# Patient Record
Sex: Male | Born: 1965 | Race: White | Hispanic: No | Smoking: Current every day smoker
Health system: Southern US, Community
[De-identification: ages and names within clinical notes are randomized; demographics above are authoritative.]

---

## 2020-02-24 DIAGNOSIS — Z419 Encounter for procedure for purposes other than remedying health state, unspecified: Secondary | ICD-10-CM | POA: Diagnosis not present

## 2020-03-26 DIAGNOSIS — Z419 Encounter for procedure for purposes other than remedying health state, unspecified: Secondary | ICD-10-CM | POA: Diagnosis not present

## 2020-04-26 DIAGNOSIS — Z419 Encounter for procedure for purposes other than remedying health state, unspecified: Secondary | ICD-10-CM | POA: Diagnosis not present

## 2020-05-26 DIAGNOSIS — Z419 Encounter for procedure for purposes other than remedying health state, unspecified: Secondary | ICD-10-CM | POA: Diagnosis not present

## 2020-06-26 DIAGNOSIS — Z419 Encounter for procedure for purposes other than remedying health state, unspecified: Secondary | ICD-10-CM | POA: Diagnosis not present

## 2020-07-26 DIAGNOSIS — Z419 Encounter for procedure for purposes other than remedying health state, unspecified: Secondary | ICD-10-CM | POA: Diagnosis not present

## 2020-08-26 DIAGNOSIS — Z419 Encounter for procedure for purposes other than remedying health state, unspecified: Secondary | ICD-10-CM | POA: Diagnosis not present

## 2020-09-26 DIAGNOSIS — Z419 Encounter for procedure for purposes other than remedying health state, unspecified: Secondary | ICD-10-CM | POA: Diagnosis not present

## 2020-10-24 DIAGNOSIS — Z419 Encounter for procedure for purposes other than remedying health state, unspecified: Secondary | ICD-10-CM | POA: Diagnosis not present

## 2020-11-24 DIAGNOSIS — Z419 Encounter for procedure for purposes other than remedying health state, unspecified: Secondary | ICD-10-CM | POA: Diagnosis not present

## 2020-12-24 DIAGNOSIS — Z419 Encounter for procedure for purposes other than remedying health state, unspecified: Secondary | ICD-10-CM | POA: Diagnosis not present

## 2021-01-24 DIAGNOSIS — Z419 Encounter for procedure for purposes other than remedying health state, unspecified: Secondary | ICD-10-CM | POA: Diagnosis not present

## 2021-02-23 DIAGNOSIS — Z419 Encounter for procedure for purposes other than remedying health state, unspecified: Secondary | ICD-10-CM | POA: Diagnosis not present

## 2021-03-26 DIAGNOSIS — Z419 Encounter for procedure for purposes other than remedying health state, unspecified: Secondary | ICD-10-CM | POA: Diagnosis not present

## 2021-04-26 DIAGNOSIS — Z419 Encounter for procedure for purposes other than remedying health state, unspecified: Secondary | ICD-10-CM | POA: Diagnosis not present

## 2021-05-26 DIAGNOSIS — Z419 Encounter for procedure for purposes other than remedying health state, unspecified: Secondary | ICD-10-CM | POA: Diagnosis not present

## 2021-06-26 DIAGNOSIS — Z419 Encounter for procedure for purposes other than remedying health state, unspecified: Secondary | ICD-10-CM | POA: Diagnosis not present

## 2021-07-26 DIAGNOSIS — Z419 Encounter for procedure for purposes other than remedying health state, unspecified: Secondary | ICD-10-CM | POA: Diagnosis not present

## 2021-08-26 DIAGNOSIS — Z419 Encounter for procedure for purposes other than remedying health state, unspecified: Secondary | ICD-10-CM | POA: Diagnosis not present

## 2021-09-26 DIAGNOSIS — Z419 Encounter for procedure for purposes other than remedying health state, unspecified: Secondary | ICD-10-CM | POA: Diagnosis not present

## 2021-10-24 DIAGNOSIS — Z419 Encounter for procedure for purposes other than remedying health state, unspecified: Secondary | ICD-10-CM | POA: Diagnosis not present

## 2021-11-24 DIAGNOSIS — Z419 Encounter for procedure for purposes other than remedying health state, unspecified: Secondary | ICD-10-CM | POA: Diagnosis not present

## 2021-12-24 DIAGNOSIS — Z419 Encounter for procedure for purposes other than remedying health state, unspecified: Secondary | ICD-10-CM | POA: Diagnosis not present

## 2022-01-24 DIAGNOSIS — Z419 Encounter for procedure for purposes other than remedying health state, unspecified: Secondary | ICD-10-CM | POA: Diagnosis not present

## 2022-01-25 ENCOUNTER — Emergency Department
Admission: EM | Admit: 2022-01-25 | Discharge: 2022-01-25 | Disposition: A | Discharge status: Home / Self Care | Payer: No Typology Code available for payment source | Attending: Emergency Medicine | Admitting: Emergency Medicine

## 2022-01-25 ENCOUNTER — Other Ambulatory Visit: Payer: Self-pay

## 2022-01-25 ENCOUNTER — Emergency Department: Payer: No Typology Code available for payment source

## 2022-01-25 DIAGNOSIS — R1031 Right lower quadrant pain: Secondary | ICD-10-CM | POA: Diagnosis not present

## 2022-01-25 DIAGNOSIS — D72829 Elevated white blood cell count, unspecified: Secondary | ICD-10-CM | POA: Diagnosis not present

## 2022-01-25 LAB — URINALYSIS, ROUTINE W REFLEX MICROSCOPIC
Bilirubin Urine: NEGATIVE
Glucose, UA: NEGATIVE mg/dL
Hgb urine dipstick: NEGATIVE
Ketones, ur: NEGATIVE mg/dL
Leukocytes,Ua: NEGATIVE
Nitrite: NEGATIVE
Protein, ur: NEGATIVE mg/dL
Specific Gravity, Urine: 1.004 — ABNORMAL LOW (ref 1.005–1.030)
pH: 6 (ref 5.0–8.0)

## 2022-01-25 LAB — COMPREHENSIVE METABOLIC PANEL
ALT: 21 U/L (ref 0–44)
AST: 20 U/L (ref 15–41)
Albumin: 4.2 g/dL (ref 3.5–5.0)
Alkaline Phosphatase: 66 U/L (ref 38–126)
Anion gap: 6 (ref 5–15)
BUN: 8 mg/dL (ref 6–20)
CO2: 24 mmol/L (ref 22–32)
Calcium: 9.1 mg/dL (ref 8.9–10.3)
Chloride: 108 mmol/L (ref 98–111)
Creatinine, Ser: 0.78 mg/dL (ref 0.61–1.24)
GFR, Estimated: >60 mL/min (ref >60)
Glucose, Bld: 82 mg/dL (ref 70–99)
Potassium: 3.9 mmol/L (ref 3.5–5.1)
Sodium: 138 mmol/L (ref 135–145)
Total Bilirubin: 0.9 mg/dL (ref 0.3–1.2)
Total Protein: 7.4 g/dL (ref 6.5–8.1)

## 2022-01-25 LAB — CBC
HCT: 46.2 % (ref 39.0–52.0)
Hemoglobin: 15.8 g/dL (ref 13.0–17.0)
MCH: 31.4 pg (ref 26.0–34.0)
MCHC: 34.2 g/dL (ref 30.0–36.0)
MCV: 91.8 fL (ref 80.0–100.0)
Platelets: 452 10*3/uL — ABNORMAL HIGH (ref 150–400)
RBC: 5.03 MIL/uL (ref 4.22–5.81)
RDW: 13.6 % (ref 11.5–15.5)
WBC: 13 10*3/uL — ABNORMAL HIGH (ref 4.0–10.5)
nRBC: 0 % (ref 0.0–0.2)

## 2022-01-25 LAB — LIPASE, BLOOD: Lipase: 32 U/L (ref 11–51)

## 2022-01-25 MED ORDER — DICYCLOMINE HCL 10 MG PO CAPS: 10.0000 mg | ORAL_CAPSULE | Freq: Two times a day (BID) | ORAL | 0 refills | Status: AC | PRN

## 2022-01-25 MED ORDER — IOHEXOL 300 MG/ML  SOLN
100.0000 mL | Freq: Once | INTRAMUSCULAR | Status: AC | PRN
  Administered 2022-01-25: 100 mL via INTRAVENOUS

## 2022-01-25 MED ORDER — POLYETHYLENE GLYCOL 3350 17 GM/SCOOP PO POWD: 17.0000 g | Freq: Every day | ORAL | 0 refills | Status: AC

## 2022-01-25 NOTE — ED Triage Notes (Signed)
Pt reports lower R sided abdominal pain x 1 week worse on movement. Pt denies N/V/D or urinary symptoms.

## 2022-01-25 NOTE — ED Provider Notes (Signed)
[facility] Provider Note    Event Date/Time   First MD Initiated Contact with Patient 01/25/22 1733     (approximate)  History   Chief Complaint: Abdominal Pain  HPI  Gary Donovan is a 56 y.o. male with no significant past medical history presents emergency department for right lower quadrant abdominal pain.  According to the patient for the past 1 week or so he has been experiencing a vague sharp discomfort in the right lower quadrant.  Patient denies any nausea vomiting or diarrhea.  No dysuria.  Denies any prior history of kidney stones although states his son has had a kidney stone.  No hematuria.  Physical Exam   Triage Vital Signs: ED Triage Vitals  Enc Vitals Group     BP 01/25/22 1645 (!) 123/95     Pulse Rate 01/25/22 1645 90     Resp 01/25/22 1645 18     Temp 01/25/22 1645 98.5 F (36.9 C)     Temp Source 01/25/22 1645 Oral     SpO2 01/25/22 1645 95 %     Weight 01/25/22 1646 149 lb (67.6 kg)     Height 01/25/22 1646 5\' 11"  (1.803 m)     Head Circumference --      Peak Flow --      Pain Score 01/25/22 1646 5     Pain Loc --      Pain Edu? --      Excl. in GC? --     Most recent vital signs: Vitals:   01/25/22 1645  BP: (!) 123/95  Pulse: 90  Resp: 18  Temp: 98.5 F (36.9 C)  SpO2: 95%    General: Awake, no distress.  CV:  Good peripheral perfusion.  Regular rate and rhythm  Resp:  Normal effort.  Equal breath sounds bilaterally.  Abd:  No distention.  Soft, mild to moderate right lower quadrant tenderness no rebound or guarding.  No distention.    ED Results / Procedures / Treatments   RADIOLOGY  I reviewed the CT images I do not see any acute abnormality on my evaluation. Radiology has read the CT scan is largely negative.   MEDICATIONS ORDERED IN ED: Medications - No data to display   IMPRESSION / MDM / ASSESSMENT AND PLAN / ED COURSE  I reviewed the triage vital signs and the nursing notes.  Patient's  presentation is most consistent with acute presentation with potential threat to life or bodily function.  Patient presents emergency department for lower quadrant abdominal pain x1 week.  Overall the patient appears well, no distress.  Does have mild to moderate right lower quadrant tenderness otherwise benign abdomen.  Patient's labs show mild leukocytosis of 13,000 otherwise normal CBC.  Normal urinalysis without any blood.  And a reassuring chemistry with normal renal function.  Given the patient's continued right lower quadrant pain as well as mild to moderate tenderness to palpation we will obtain CT imaging the abdomen/pelvis to further evaluate.  Patient agreeable to plan of care.  Patient's work-up is overall reassuring.  CT scan does not appear to show any acute abnormality.  Given the patient's reassuring work-up we will discharge with Bentyl as well as MiraLAX.  We will have the patient follow-up with GI medicine should he continue to have right lower quadrant abdominal discomfort.  I discussed return precautions.  Patient agreeable to plan of care.  FINAL CLINICAL IMPRESSION(S) / ED DIAGNOSES   Right lower quadrant abdominal pain  Note:  This document was prepared using Dragon voice recognition software and may include unintentional dictation errors.   Minna Antis, MD 01/25/22 1840

## 2022-02-23 DIAGNOSIS — Z419 Encounter for procedure for purposes other than remedying health state, unspecified: Secondary | ICD-10-CM | POA: Diagnosis not present

## 2022-03-26 DIAGNOSIS — Z419 Encounter for procedure for purposes other than remedying health state, unspecified: Secondary | ICD-10-CM | POA: Diagnosis not present

## 2022-04-26 DIAGNOSIS — Z419 Encounter for procedure for purposes other than remedying health state, unspecified: Secondary | ICD-10-CM | POA: Diagnosis not present

## 2022-05-26 DIAGNOSIS — Z419 Encounter for procedure for purposes other than remedying health state, unspecified: Secondary | ICD-10-CM | POA: Diagnosis not present

## 2022-06-26 DIAGNOSIS — Z419 Encounter for procedure for purposes other than remedying health state, unspecified: Secondary | ICD-10-CM | POA: Diagnosis not present

## 2022-07-26 DIAGNOSIS — Z419 Encounter for procedure for purposes other than remedying health state, unspecified: Secondary | ICD-10-CM | POA: Diagnosis not present

## 2022-08-26 DIAGNOSIS — Z419 Encounter for procedure for purposes other than remedying health state, unspecified: Secondary | ICD-10-CM | POA: Diagnosis not present

## 2022-09-26 DIAGNOSIS — Z419 Encounter for procedure for purposes other than remedying health state, unspecified: Secondary | ICD-10-CM | POA: Diagnosis not present

## 2022-10-25 DIAGNOSIS — Z419 Encounter for procedure for purposes other than remedying health state, unspecified: Secondary | ICD-10-CM | POA: Diagnosis not present

## 2022-11-25 DIAGNOSIS — Z419 Encounter for procedure for purposes other than remedying health state, unspecified: Secondary | ICD-10-CM | POA: Diagnosis not present

## 2022-12-25 DIAGNOSIS — Z419 Encounter for procedure for purposes other than remedying health state, unspecified: Secondary | ICD-10-CM | POA: Diagnosis not present

## 2023-01-25 DIAGNOSIS — Z419 Encounter for procedure for purposes other than remedying health state, unspecified: Secondary | ICD-10-CM | POA: Diagnosis not present

## 2023-02-24 DIAGNOSIS — Z419 Encounter for procedure for purposes other than remedying health state, unspecified: Secondary | ICD-10-CM | POA: Diagnosis not present

## 2023-03-27 DIAGNOSIS — Z419 Encounter for procedure for purposes other than remedying health state, unspecified: Secondary | ICD-10-CM | POA: Diagnosis not present

## 2023-04-27 DIAGNOSIS — Z419 Encounter for procedure for purposes other than remedying health state, unspecified: Secondary | ICD-10-CM | POA: Diagnosis not present

## 2023-05-27 DIAGNOSIS — Z419 Encounter for procedure for purposes other than remedying health state, unspecified: Secondary | ICD-10-CM | POA: Diagnosis not present

## 2023-06-27 DIAGNOSIS — Z419 Encounter for procedure for purposes other than remedying health state, unspecified: Secondary | ICD-10-CM | POA: Diagnosis not present

## 2023-07-27 DIAGNOSIS — Z419 Encounter for procedure for purposes other than remedying health state, unspecified: Secondary | ICD-10-CM | POA: Diagnosis not present

## 2023-08-27 DIAGNOSIS — Z419 Encounter for procedure for purposes other than remedying health state, unspecified: Secondary | ICD-10-CM | POA: Diagnosis not present

## 2023-09-27 DIAGNOSIS — Z419 Encounter for procedure for purposes other than remedying health state, unspecified: Secondary | ICD-10-CM | POA: Diagnosis not present

## 2023-10-25 DIAGNOSIS — Z419 Encounter for procedure for purposes other than remedying health state, unspecified: Secondary | ICD-10-CM | POA: Diagnosis not present

## 2023-12-06 DIAGNOSIS — Z419 Encounter for procedure for purposes other than remedying health state, unspecified: Secondary | ICD-10-CM | POA: Diagnosis not present

## 2024-01-05 DIAGNOSIS — Z419 Encounter for procedure for purposes other than remedying health state, unspecified: Secondary | ICD-10-CM | POA: Diagnosis not present

## 2024-02-05 DIAGNOSIS — Z419 Encounter for procedure for purposes other than remedying health state, unspecified: Secondary | ICD-10-CM | POA: Diagnosis not present

## 2024-03-06 DIAGNOSIS — Z419 Encounter for procedure for purposes other than remedying health state, unspecified: Secondary | ICD-10-CM | POA: Diagnosis not present

## 2024-03-31 IMAGING — CT CT ABD-PELV W/ CM
2 of 5 series · 16 of 46 positions shown, 18 images · IV contrast (agent unspecified)
Comparison: None Available.

CLINICAL DATA: Right lower quadrant pain for 1 week.

EXAM:
CT ABDOMEN AND PELVIS WITH CONTRAST
TECHNIQUE: Multidetector CT imaging of the abdomen and pelvis was performed
using the standard protocol following bolus administration of
intravenous contrast.

[Series 2: routine abd/pel with · axial · 0.75mm/px · z∈[-678,-238]mm · 13 of 100 slices shown, 15 images]
[im 6/100  soft-tissue]
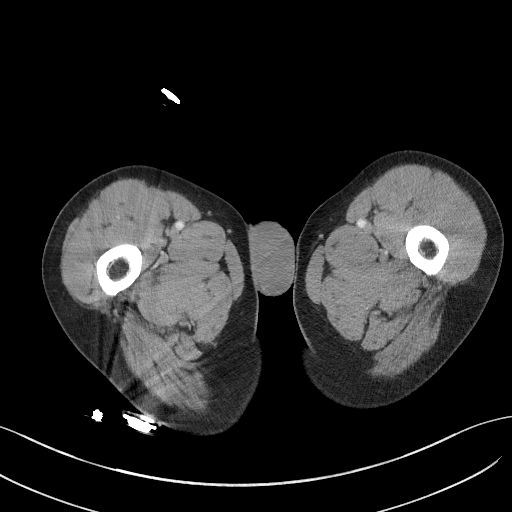
[im 6/100  bone]
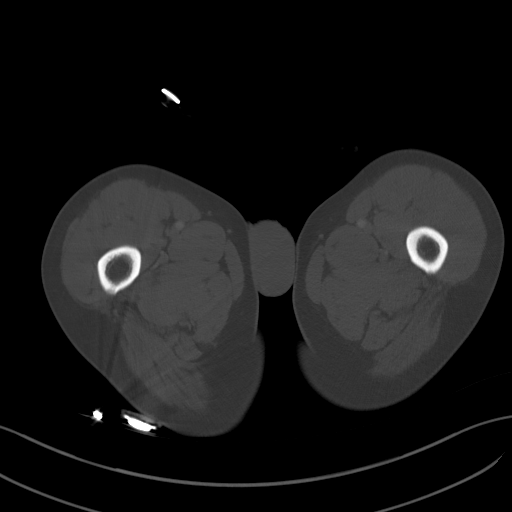
[im 12/100  soft-tissue]
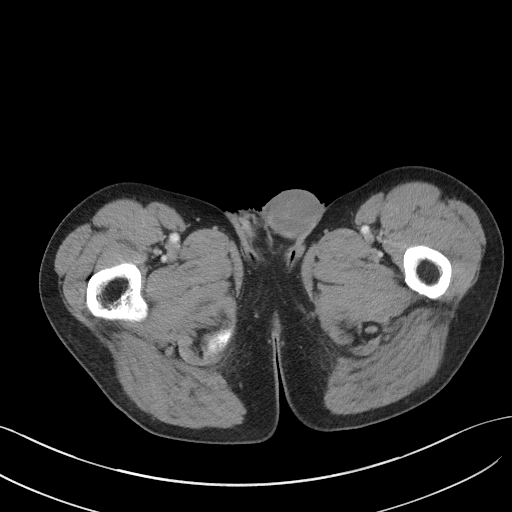
[im 23/100  soft-tissue]
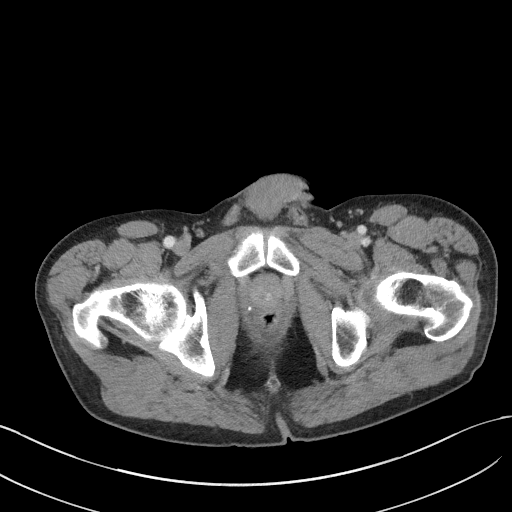
[im 28/100  soft-tissue]
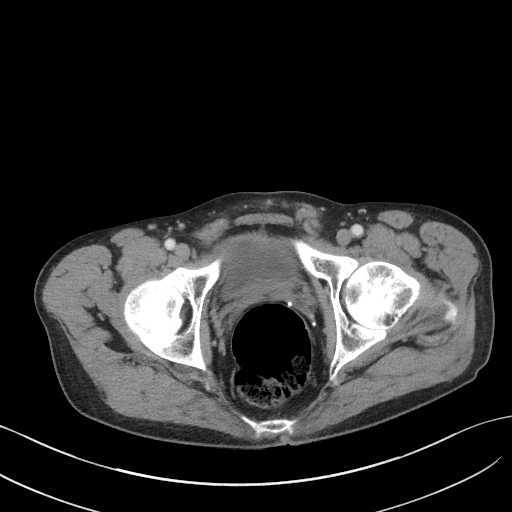
[im 34/100  soft-tissue]
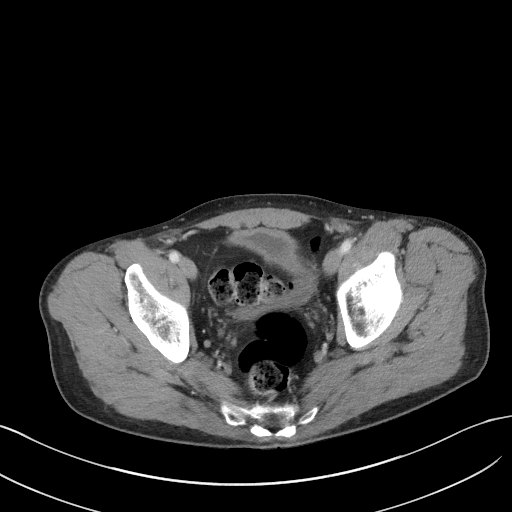
[im 45/100  soft-tissue]
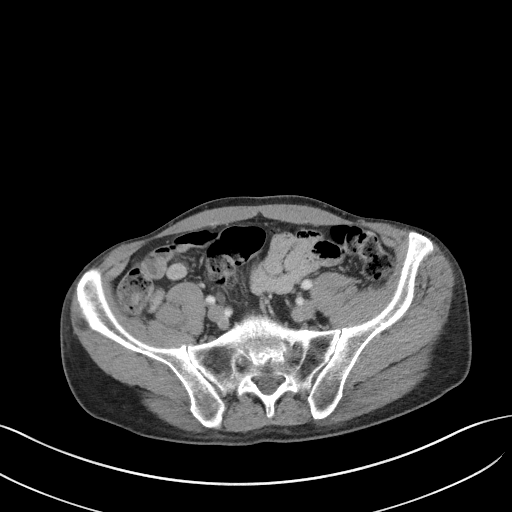
[im 50/100  soft-tissue]
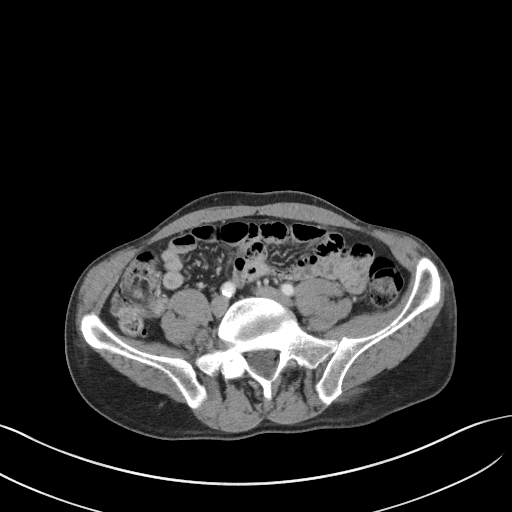
[im 56/100  soft-tissue]
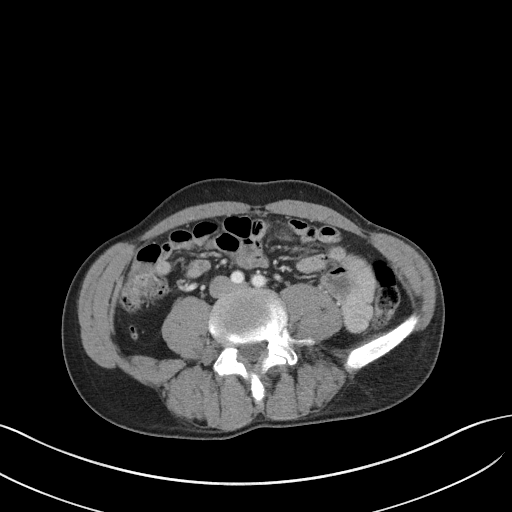
[im 67/100  soft-tissue]
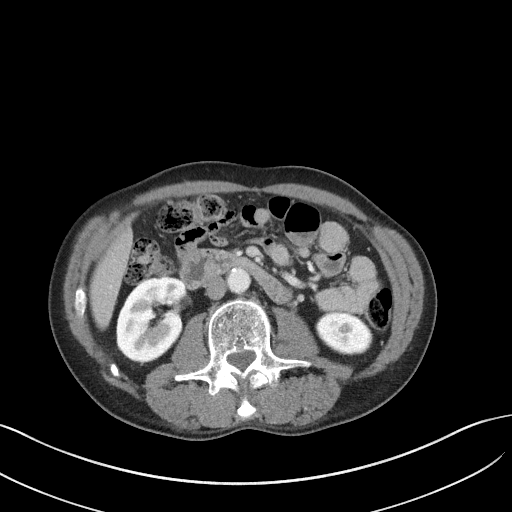
[im 67/100  bone]
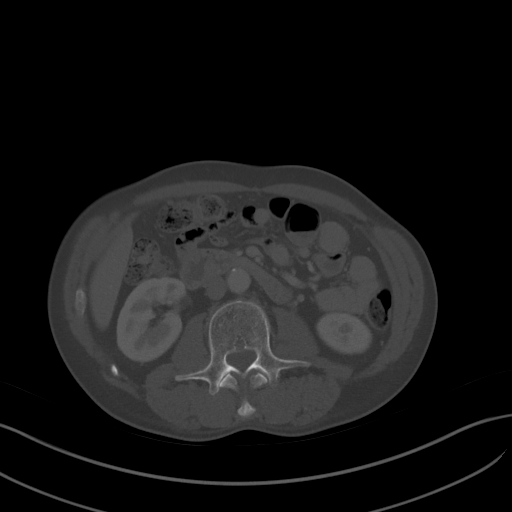
[im 72/100  soft-tissue]
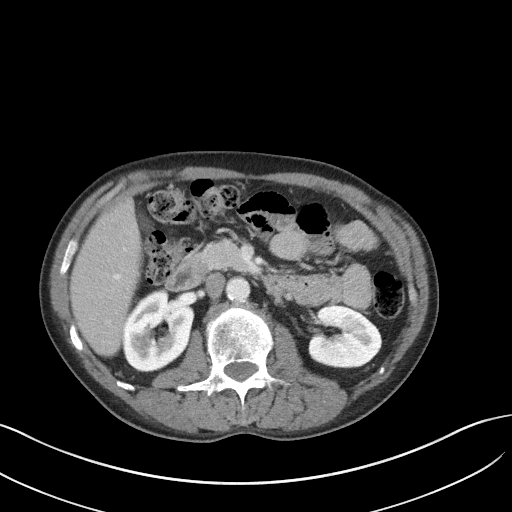
[im 78/100  soft-tissue]
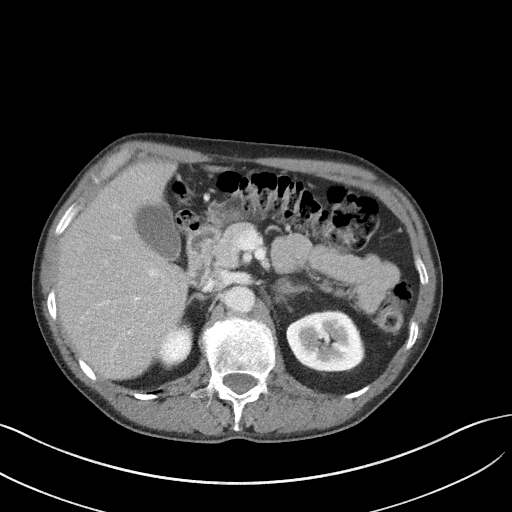
[im 89/100  soft-tissue]
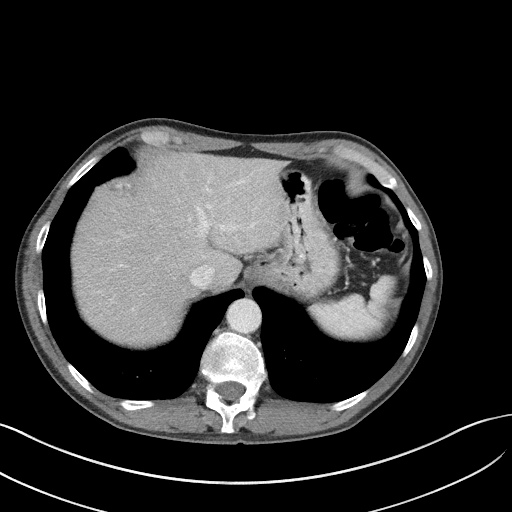
[im 94/100  soft-tissue]
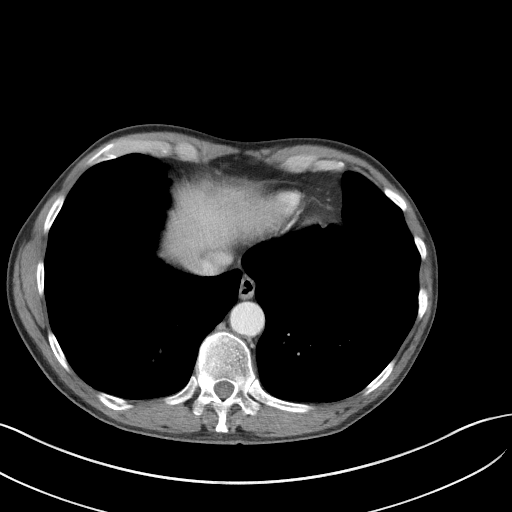

[Series 5: coronal st · coronal · 0.70mm/px · 3 of 86 slices shown]
[im 29/86  soft-tissue]
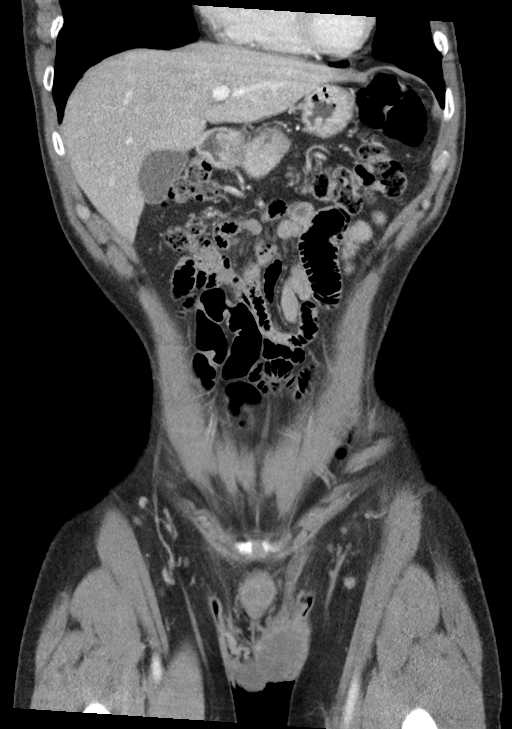
[im 38/86  soft-tissue]
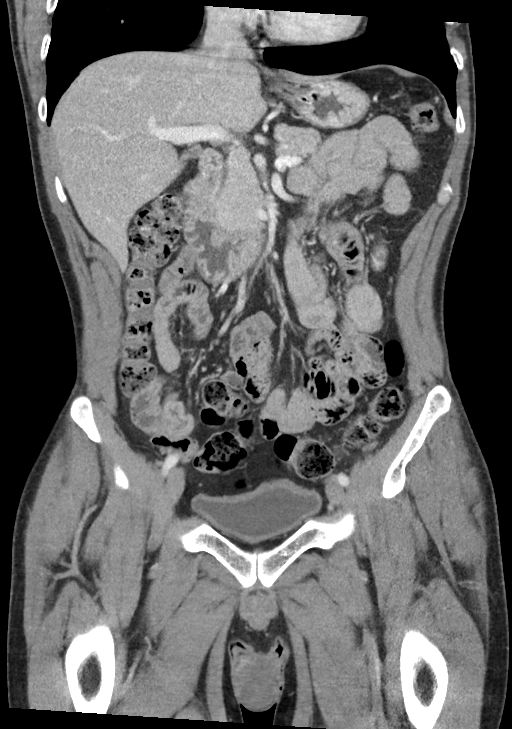
[im 48/86  soft-tissue]
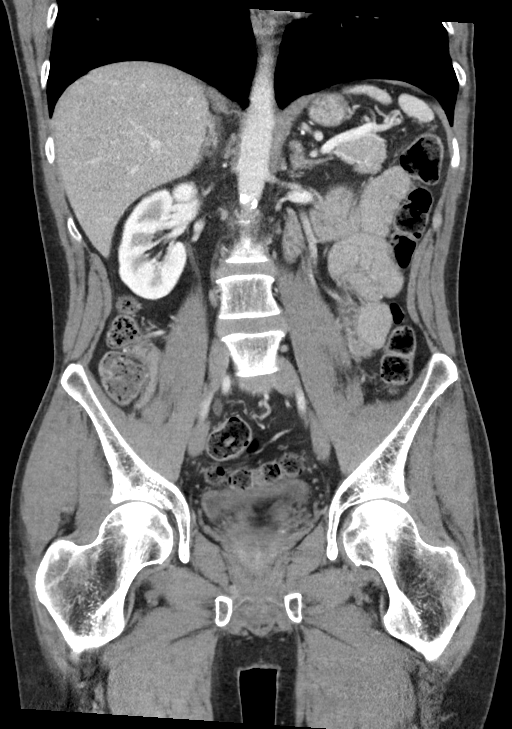

[16 of 46 positions shown; findings below may reference images not displayed]

RADIATION DOSE REDUCTION: This exam was performed according to the
departmental dose-optimization program which includes automated
exposure control, adjustment of the mA and/or kV according to
patient size and/or use of iterative reconstruction technique.

CONTRAST:  100mL OMNIPAQUE IOHEXOL 300 MG/ML  SOLN
FINDINGS: Lower Chest: No acute findings.

Hepatobiliary: 2 cm benign hemangioma is seen in segment 4A of the
left lobe. No other hepatic masses are identified. Gallbladder is
unremarkable. No evidence of biliary ductal dilatation.

Pancreas:  No mass or inflammatory changes.

Spleen: Within normal limits in size and appearance.

Adrenals/Urinary Tract: No masses identified. No evidence of
ureteral calculi or hydronephrosis.

Stomach/Bowel: No evidence of obstruction, inflammatory process or
abnormal fluid collections. Normal appendix visualized.

Vascular/Lymphatic: No pathologically enlarged lymph nodes. No acute
vascular findings. Aortic atherosclerotic calcification incidentally
noted.

Reproductive:  No mass or other significant abnormality.

Other:  None.

Musculoskeletal:  No suspicious bone lesions identified.
IMPRESSION: No evidence of appendicitis or other acute findings.

2 cm benign hepatic hemangioma.

Aortic Atherosclerosis ([id number]).

## 2024-04-06 DIAGNOSIS — Z419 Encounter for procedure for purposes other than remedying health state, unspecified: Secondary | ICD-10-CM | POA: Diagnosis not present

## 2024-04-20 DIAGNOSIS — Z7689 Persons encountering health services in other specified circumstances: Secondary | ICD-10-CM | POA: Diagnosis not present

## 2024-04-23 DIAGNOSIS — Z7689 Persons encountering health services in other specified circumstances: Secondary | ICD-10-CM | POA: Diagnosis not present

## 2024-05-07 DIAGNOSIS — Z419 Encounter for procedure for purposes other than remedying health state, unspecified: Secondary | ICD-10-CM | POA: Diagnosis not present

## 2024-08-06 DIAGNOSIS — Z419 Encounter for procedure for purposes other than remedying health state, unspecified: Secondary | ICD-10-CM | POA: Diagnosis not present
